# Patient Record
Sex: Female | Born: 1960 | Race: White | Hispanic: No | Smoking: Never smoker
Health system: Southern US, Community
[De-identification: ages and names within clinical notes are randomized; demographics above are authoritative.]

## PROBLEM LIST (undated history)

## (undated) DIAGNOSIS — K5090 Crohn's disease, unspecified, without complications: Secondary | ICD-10-CM

## (undated) DIAGNOSIS — K859 Acute pancreatitis without necrosis or infection, unspecified: Secondary | ICD-10-CM

## (undated) DIAGNOSIS — M549 Dorsalgia, unspecified: Secondary | ICD-10-CM

## (undated) DIAGNOSIS — F988 Other specified behavioral and emotional disorders with onset usually occurring in childhood and adolescence: Secondary | ICD-10-CM

## (undated) DIAGNOSIS — N289 Disorder of kidney and ureter, unspecified: Secondary | ICD-10-CM

## (undated) DIAGNOSIS — G8929 Other chronic pain: Secondary | ICD-10-CM

## (undated) HISTORY — PX: CHOLECYSTECTOMY: SHX55

## (undated) HISTORY — PX: APPENDECTOMY: SHX54

## (undated) HISTORY — PX: ABDOMINAL HYSTERECTOMY: SHX81

---

## 2005-08-07 ENCOUNTER — Emergency Department (HOSPITAL_COMMUNITY): Admission: EM | Admit: 2005-08-07 | Discharge: 2005-08-07 | Payer: Self-pay | Admitting: Emergency Medicine

## 2005-08-16 ENCOUNTER — Emergency Department (HOSPITAL_COMMUNITY): Admission: EM | Admit: 2005-08-16 | Discharge: 2005-08-16 | Payer: Self-pay | Admitting: Emergency Medicine

## 2005-08-23 ENCOUNTER — Emergency Department: Payer: Self-pay | Admitting: Emergency Medicine

## 2005-09-02 ENCOUNTER — Emergency Department: Payer: Self-pay | Admitting: General Practice

## 2005-09-11 ENCOUNTER — Emergency Department (HOSPITAL_COMMUNITY): Admission: EM | Admit: 2005-09-11 | Discharge: 2005-09-11 | Payer: Self-pay | Admitting: Emergency Medicine

## 2005-09-15 ENCOUNTER — Emergency Department (HOSPITAL_COMMUNITY): Admission: EM | Admit: 2005-09-15 | Discharge: 2005-09-15 | Payer: Self-pay | Admitting: Emergency Medicine

## 2006-03-08 ENCOUNTER — Emergency Department (HOSPITAL_COMMUNITY): Admission: EM | Admit: 2006-03-08 | Discharge: 2006-03-08 | Payer: Self-pay | Admitting: Emergency Medicine

## 2006-03-23 ENCOUNTER — Ambulatory Visit (HOSPITAL_COMMUNITY): Admission: RE | Admit: 2006-03-23 | Discharge: 2006-03-23 | Payer: Self-pay | Admitting: Emergency Medicine

## 2007-10-19 ENCOUNTER — Emergency Department (HOSPITAL_BASED_OUTPATIENT_CLINIC_OR_DEPARTMENT_OTHER): Admission: EM | Admit: 2007-10-19 | Discharge: 2007-10-19 | Payer: Self-pay | Admitting: Emergency Medicine

## 2007-11-15 ENCOUNTER — Emergency Department (HOSPITAL_BASED_OUTPATIENT_CLINIC_OR_DEPARTMENT_OTHER): Admission: EM | Admit: 2007-11-15 | Discharge: 2007-11-16 | Payer: Self-pay | Admitting: Emergency Medicine

## 2008-03-08 ENCOUNTER — Emergency Department (HOSPITAL_COMMUNITY): Admission: EM | Admit: 2008-03-08 | Discharge: 2008-03-08 | Payer: Self-pay | Admitting: Emergency Medicine

## 2008-03-29 ENCOUNTER — Ambulatory Visit: Payer: Self-pay | Admitting: Occupational Medicine

## 2008-03-29 LAB — CONVERTED CEMR LAB
Ketones, urine, test strip: NEGATIVE
Nitrite: NEGATIVE
Urobilinogen, UA: 0.2
pH: 7

## 2010-06-18 LAB — URINALYSIS, ROUTINE W REFLEX MICROSCOPIC
Bilirubin Urine: NEGATIVE
Hgb urine dipstick: NEGATIVE
Ketones, ur: NEGATIVE mg/dL
Nitrite: NEGATIVE
Protein, ur: NEGATIVE mg/dL
pH: 8.5 — ABNORMAL HIGH (ref 5.0–8.0)

## 2010-06-18 LAB — URINE MICROSCOPIC-ADD ON

## 2010-06-18 LAB — DIFFERENTIAL
Eosinophils Absolute: 0 10*3/uL (ref 0.0–0.7)
Eosinophils Relative: 0 % (ref 0–5)
Neutrophils Relative %: 64 % (ref 43–77)

## 2010-06-18 LAB — CBC
Hemoglobin: 11.8 g/dL — ABNORMAL LOW (ref 12.0–15.0)
Platelets: 307 10*3/uL (ref 150–400)
RDW: 13.6 % (ref 11.5–15.5)
WBC: 6.8 10*3/uL (ref 4.0–10.5)

## 2010-06-18 LAB — POCT I-STAT, CHEM 8
Calcium, Ion: 1.17 mmol/L (ref 1.12–1.32)
Glucose, Bld: 94 mg/dL (ref 70–99)
HCT: 39 % (ref 36.0–46.0)
Hemoglobin: 13.3 g/dL (ref 12.0–15.0)
Potassium: 3.6 mEq/L (ref 3.5–5.1)
TCO2: 22 mmol/L (ref 0–100)

## 2010-12-05 LAB — DIFFERENTIAL
Monocytes Absolute: 0.5
Monocytes Relative: 7
Neutro Abs: 4.3
Neutrophils Relative %: 59

## 2010-12-05 LAB — URINE CULTURE: Colony Count: >=100000

## 2010-12-05 LAB — URINALYSIS, ROUTINE W REFLEX MICROSCOPIC
Bilirubin Urine: NEGATIVE
Glucose, UA: NEGATIVE
Nitrite: POSITIVE — AB
Urobilinogen, UA: 1

## 2010-12-05 LAB — CBC
HCT: 36.9
MCV: 84.6
RBC: 4.36
RDW: 13.3
WBC: 7.3

## 2010-12-05 LAB — BASIC METABOLIC PANEL
CO2: 24
Chloride: 107
Creatinine, Ser: 1.2
Sodium: 143

## 2010-12-05 LAB — URINE MICROSCOPIC-ADD ON

## 2013-10-30 ENCOUNTER — Emergency Department (HOSPITAL_BASED_OUTPATIENT_CLINIC_OR_DEPARTMENT_OTHER)
Admission: EM | Admit: 2013-10-30 | Discharge: 2013-10-30 | Disposition: A | Discharge status: Home / Self Care | Payer: Medicaid Other | Attending: Emergency Medicine | Admitting: Emergency Medicine

## 2013-10-30 ENCOUNTER — Encounter (HOSPITAL_BASED_OUTPATIENT_CLINIC_OR_DEPARTMENT_OTHER): Payer: Self-pay | Admitting: Emergency Medicine

## 2013-10-30 DIAGNOSIS — Z9089 Acquired absence of other organs: Secondary | ICD-10-CM | POA: Diagnosis not present

## 2013-10-30 DIAGNOSIS — R1031 Right lower quadrant pain: Secondary | ICD-10-CM | POA: Insufficient documentation

## 2013-10-30 DIAGNOSIS — Z90710 Acquired absence of both cervix and uterus: Secondary | ICD-10-CM | POA: Insufficient documentation

## 2013-10-30 DIAGNOSIS — Z8659 Personal history of other mental and behavioral disorders: Secondary | ICD-10-CM | POA: Diagnosis not present

## 2013-10-30 DIAGNOSIS — R509 Fever, unspecified: Secondary | ICD-10-CM | POA: Diagnosis not present

## 2013-10-30 DIAGNOSIS — R109 Unspecified abdominal pain: Secondary | ICD-10-CM

## 2013-10-30 DIAGNOSIS — N3000 Acute cystitis without hematuria: Secondary | ICD-10-CM | POA: Diagnosis not present

## 2013-10-30 DIAGNOSIS — G8929 Other chronic pain: Secondary | ICD-10-CM | POA: Insufficient documentation

## 2013-10-30 DIAGNOSIS — R197 Diarrhea, unspecified: Secondary | ICD-10-CM | POA: Diagnosis not present

## 2013-10-30 DIAGNOSIS — Z792 Long term (current) use of antibiotics: Secondary | ICD-10-CM | POA: Diagnosis not present

## 2013-10-30 DIAGNOSIS — Z79899 Other long term (current) drug therapy: Secondary | ICD-10-CM | POA: Insufficient documentation

## 2013-10-30 HISTORY — DX: Other specified behavioral and emotional disorders with onset usually occurring in childhood and adolescence: F98.8

## 2013-10-30 HISTORY — DX: Disorder of kidney and ureter, unspecified: N28.9

## 2013-10-30 HISTORY — DX: Crohn's disease, unspecified, without complications: K50.90

## 2013-10-30 HISTORY — DX: Other chronic pain: G89.29

## 2013-10-30 HISTORY — DX: Dorsalgia, unspecified: M54.9

## 2013-10-30 LAB — CBC WITH DIFFERENTIAL/PLATELET
BASOS ABS: 0 10*3/uL (ref 0.0–0.1)
Basophils Relative: 0 % (ref 0–1)
Eosinophils Absolute: 0 10*3/uL (ref 0.0–0.7)
Eosinophils Relative: 0 % (ref 0–5)
HEMATOCRIT: 36.3 % (ref 36.0–46.0)
Hemoglobin: 12.5 g/dL (ref 12.0–15.0)
LYMPHS ABS: 2.8 10*3/uL (ref 0.7–4.0)
LYMPHS PCT: 29 % (ref 12–46)
MCH: 28.2 pg (ref 26.0–34.0)
MCHC: 34.4 g/dL (ref 30.0–36.0)
MCV: 81.9 fL (ref 78.0–100.0)
MONO ABS: 0.8 10*3/uL (ref 0.1–1.0)
Monocytes Relative: 8 % (ref 3–12)
NEUTROS ABS: 6 10*3/uL (ref 1.7–7.7)
Neutrophils Relative %: 63 % (ref 43–77)
Platelets: 368 10*3/uL (ref 150–400)
RBC: 4.43 MIL/uL (ref 3.87–5.11)
RDW: 13.1 % (ref 11.5–15.5)
WBC: 9.7 10*3/uL (ref 4.0–10.5)

## 2013-10-30 LAB — URINALYSIS, ROUTINE W REFLEX MICROSCOPIC
Bilirubin Urine: NEGATIVE
GLUCOSE, UA: NEGATIVE mg/dL
Ketones, ur: NEGATIVE mg/dL
Nitrite: POSITIVE — AB
PH: 6.5 (ref 5.0–8.0)
PROTEIN: NEGATIVE mg/dL
SPECIFIC GRAVITY, URINE: 1.018 (ref 1.005–1.030)
Urobilinogen, UA: 1 mg/dL (ref 0.0–1.0)

## 2013-10-30 LAB — COMPREHENSIVE METABOLIC PANEL
ALT: 23 U/L (ref 0–35)
AST: 23 U/L (ref 0–37)
Albumin: 3.9 g/dL (ref 3.5–5.2)
Alkaline Phosphatase: 81 U/L (ref 39–117)
Anion gap: 16 — ABNORMAL HIGH (ref 5–15)
BILIRUBIN TOTAL: 0.3 mg/dL (ref 0.3–1.2)
BUN: 14 mg/dL (ref 6–23)
CHLORIDE: 104 meq/L (ref 96–112)
CO2: 22 meq/L (ref 19–32)
CREATININE: 0.8 mg/dL (ref 0.50–1.10)
Calcium: 9.9 mg/dL (ref 8.4–10.5)
GFR calc Af Amer: >90 mL/min (ref >90)
GFR, EST NON AFRICAN AMERICAN: 84 mL/min — AB (ref >90)
GLUCOSE: 96 mg/dL (ref 70–99)
Potassium: 3.9 mEq/L (ref 3.7–5.3)
SODIUM: 142 meq/L (ref 137–147)
Total Protein: 8.1 g/dL (ref 6.0–8.3)

## 2013-10-30 LAB — URINE MICROSCOPIC-ADD ON

## 2013-10-30 LAB — LIPASE, BLOOD: Lipase: 78 U/L — ABNORMAL HIGH (ref 11–59)

## 2013-10-30 MED ORDER — CEFTRIAXONE SODIUM 1 G IJ SOLR
INTRAMUSCULAR | Status: AC
  Filled 2013-10-30: qty 10

## 2013-10-30 MED ORDER — CEPHALEXIN 500 MG PO CAPS: 500.0000 mg | ORAL_CAPSULE | Freq: Two times a day (BID) | ORAL | Status: DC

## 2013-10-30 MED ORDER — SODIUM CHLORIDE 0.9 % IV SOLN
1000.0000 mL | INTRAVENOUS | Status: DC
  Administered 2013-10-30: 1000 mL via INTRAVENOUS

## 2013-10-30 MED ORDER — ONDANSETRON HCL 4 MG/2ML IJ SOLN
4.0000 mg | Freq: Once | INTRAMUSCULAR | Status: AC
  Administered 2013-10-30: 4 mg via INTRAVENOUS
  Filled 2013-10-30: qty 2

## 2013-10-30 MED ORDER — SODIUM CHLORIDE 0.9 % IV SOLN
1000.0000 mL | Freq: Once | INTRAVENOUS | Status: AC
  Administered 2013-10-30: 1000 mL via INTRAVENOUS

## 2013-10-30 MED ORDER — HYDROMORPHONE HCL PF 1 MG/ML IJ SOLN
1.0000 mg | INTRAMUSCULAR | Status: DC | PRN
  Administered 2013-10-30 (×2): 1 mg via INTRAVENOUS
  Filled 2013-10-30 (×2): qty 1

## 2013-10-30 MED ORDER — PHENAZOPYRIDINE HCL 200 MG PO TABS: 200.0000 mg | ORAL_TABLET | Freq: Three times a day (TID) | ORAL | Status: DC

## 2013-10-30 MED ORDER — CEFTRIAXONE SODIUM 1 G IJ SOLR
1.0000 g | Freq: Once | INTRAMUSCULAR | Status: AC
  Administered 2013-10-30: 1 g via INTRAVENOUS

## 2013-10-30 NOTE — ED Notes (Signed)
Pt c/o abd pain that began 2 days ago. She sts feels like this is a crohn's flare up but it is worse than usual. She is also c/o N/V/D and a fever of 100.

## 2013-10-30 NOTE — ED Provider Notes (Signed)
CSN: [id number]     Arrival date & time 10/30/13  7357 History   First MD Initiated Contact with Patient 10/30/13 0654     Chief Complaint  Patient presents with  . Abdominal Pain    Patient is a 53 y.o. female presenting with abdominal pain. The history is provided by the patient.  Abdominal Pain Pain location:  RLQ Pain quality: aching and sharp   Pain radiates to:  Back Pain severity:  Severe Onset quality:  Gradual Duration:  2 days Timing:  Constant Progression:  Worsening Chronicity:  Recurrent Relieved by:  Nothing Ineffective treatments: Prescription medications. Associated symptoms: diarrhea, fever, nausea and vomiting    the patient has a history of Crohn's disease. She is followed by a physician in [place]. Patient states that this feels like a flareup of her Crohn's disease. She has had several episodes of nausea vomiting associated with loose diarrhea stools. He has noticed some blood streaking in her stool.  The patient states she normally takes oxycodone for her flareups. This is prescribed to her doctor. She also takes some other medication that begins with an L.  These symptoms started a couple of days ago. Her regular medications are not helping. Because of her persistent symptoms she came to the emergency department today. Her doctor works in [place]  Past Medical History  Diagnosis Date  . Crohn's disease   . ADD (attention deficit disorder)   . Renal disorder   . Chronic back pain    Past Surgical History  Procedure Laterality Date  . Abdominal hysterectomy    . Cholecystectomy    . Appendectomy     No family history on file. History  Substance Use Topics  . Smoking status: Never Smoker   . Smokeless tobacco: Not on file  . Alcohol Use: No   OB History   Grav Para Term Preterm Abortions TAB SAB Ect Mult Living                 Review of Systems  Constitutional: Positive for fever.  Gastrointestinal: Positive for nausea, vomiting, abdominal  pain and diarrhea.  All other systems reviewed and are negative.     Allergies  Ketorolac tromethamine; Promethazine hcl; and Propoxyphene n-acetaminophen  Home Medications   Prior to Admission medications   Medication Sig Start Date End Date Taking? Authorizing Provider  amphetamine-dextroamphetamine (ADDERALL XR) 10 MG 24 hr capsule Take 10 mg by mouth daily.   Yes Historical Provider, MD  oxycodone (ROXICODONE) 30 MG immediate release tablet Take 30 mg by mouth every 4 (four) hours as needed for pain.   Yes Historical Provider, MD  Vitamin D, Ergocalciferol, (DRISDOL) 50000 UNITS CAPS capsule Take 50,000 Units by mouth every 7 (seven) days.   Yes Historical Provider, MD  cephALEXin (KEFLEX) 500 MG capsule Take 1 capsule (500 mg total) by mouth 2 (two) times daily. 10/30/13   Linwood Dibbles, MD  phenazopyridine (PYRIDIUM) 200 MG tablet Take 1 tablet (200 mg total) by mouth 3 (three) times daily. 10/30/13   Linwood Dibbles, MD   BP 125/97  Pulse 61  Temp(Src) 97.9 F (36.6 C) (Oral)  Resp 16  Ht 5\' 4"  (1.626 m)  Wt 135 lb (61.236 kg)  BMI 23.16 kg/m2  SpO2 100% Physical Exam  Nursing note and vitals reviewed. Constitutional: She appears well-developed and well-nourished. No distress.  HENT:  Head: Normocephalic and atraumatic.  Right Ear: External ear normal.  Left Ear: External ear normal.  Eyes: Conjunctivae are normal.  Right eye exhibits no discharge. Left eye exhibits no discharge. No scleral icterus.  Neck: Neck supple. No tracheal deviation present.  Cardiovascular: Normal rate, regular rhythm and intact distal pulses.   Pulmonary/Chest: Effort normal and breath sounds normal. No stridor. No respiratory distress. She has no wheezes. She has no rales.  Abdominal: Soft. Bowel sounds are normal. She exhibits no distension. There is tenderness in the right lower quadrant. There is no rebound and no guarding. No hernia.  Musculoskeletal: She exhibits no edema and no tenderness.   Neurological: She is alert. She has normal strength. No cranial nerve deficit (no facial droop, extraocular movements intact, no slurred speech) or sensory deficit. She exhibits normal muscle tone. She displays no seizure activity. Coordination normal.  Skin: Skin is warm and dry. No rash noted.  Psychiatric: She has a normal mood and affect.    ED Course  Procedures (including critical care time) Labs Review Labs Reviewed  URINALYSIS, ROUTINE W REFLEX MICROSCOPIC - Abnormal; Notable for the following:    APPearance CLOUDY (*)    Hgb urine dipstick TRACE (*)    Nitrite POSITIVE (*)    Leukocytes, UA LARGE (*)    All other components within normal limits  COMPREHENSIVE METABOLIC PANEL - Abnormal; Notable for the following:    GFR calc non Af Amer 84 (*)    Anion gap 16 (*)    All other components within normal limits  LIPASE, BLOOD - Abnormal; Notable for the following:    Lipase 78 (*)    All other components within normal limits  URINE MICROSCOPIC-ADD ON - Abnormal; Notable for the following:    Squamous Epithelial / LPF FEW (*)    Bacteria, UA MANY (*)    All other components within normal limits  CBC WITH DIFFERENTIAL    Medications  0.9 %  sodium chloride infusion (not administered)    Followed by  0.9 %  sodium chloride infusion (1,000 mLs Intravenous New Bag/Given 10/30/13 0806)    Followed by  0.9 %  sodium chloride infusion (0 mLs Intravenous Stopped 10/30/13 0805)  HYDROmorphone (DILAUDID) injection 1 mg (1 mg Intravenous Given 10/30/13 0835)  cefTRIAXone (ROCEPHIN) 1 g in dextrose 5 % 50 mL IVPB (not administered)  ondansetron (ZOFRAN) injection 4 mg (4 mg Intravenous Given 10/30/13 0725)    Reviewed [place] controlled substance databse: Regular monthly prescriptions from only one provider.  PA Huel Cote MDM   Final diagnoses:  Acute cystitis without hematuria  Abdominal pain, unspecified abdominal location    Urinalysis is consistent with a urinary tract  infection.  This could be the cause of the patient's symptoms. She has history of Crohn's disease which could be a contributing factor as well but at this point she has no leukocytosis or guarding on abdominal exam. I doubt complicating factors such as abscess or obstruction. Lipase is mildly elevated and I discussed this with the patient at this time I doubt pancreatitis.  Plan will be to discharge her home on a course of oral antibiotics. Continue her home medications. Follow up with her primary doctor this week. Warning signs and precautions were discussed.  Return to emergency room for fever worsening symptoms    Linwood Dibbles, MD 10/30/13 [phone]

## 2013-10-30 NOTE — Discharge Instructions (Signed)
Abdominal Pain, Women °Abdominal (stomach, pelvic, or belly) pain can be caused by many things. It is important to tell your doctor: °· The location of the pain. °· Does it come and go or is it present all the time? °· Are there things that start the pain (eating certain foods, exercise)? °· Are there other symptoms associated with the pain (fever, nausea, vomiting, diarrhea)? °All of this is helpful to know when trying to find the cause of the pain. °CAUSES  °· Stomach: virus or bacteria infection, or ulcer. °· Intestine: appendicitis (inflamed appendix), regional ileitis (Crohn's disease), ulcerative colitis (inflamed colon), irritable bowel syndrome, diverticulitis (inflamed diverticulum of the colon), or cancer of the stomach or intestine. °· Gallbladder disease or stones in the gallbladder. °· Kidney disease, kidney stones, or infection. °· Pancreas infection or cancer. °· Fibromyalgia (pain disorder). °· Diseases of the female organs: °¨ Uterus: fibroid (non-cancerous) tumors or infection. °¨ Fallopian tubes: infection or tubal pregnancy. °¨ Ovary: cysts or tumors. °¨ Pelvic adhesions (scar tissue). °¨ Endometriosis (uterus lining tissue growing in the pelvis and on the pelvic organs). °¨ Pelvic congestion syndrome (female organs filling up with blood just before the menstrual period). °¨ Pain with the menstrual period. °¨ Pain with ovulation (producing an egg). °¨ Pain with an IUD (intrauterine device, birth control) in the uterus. °¨ Cancer of the female organs. °· Functional pain (pain not caused by a disease, may improve without treatment). °· Psychological pain. °· Depression. °DIAGNOSIS  °Your doctor will decide the seriousness of your pain by doing an examination. °· Blood tests. °· X-rays. °· Ultrasound. °· CT scan (computed tomography, special type of X-ray). °· MRI (magnetic resonance imaging). °· Cultures, for infection. °· Barium enema (dye inserted in the large intestine, to better view it with  X-rays). °· Colonoscopy (looking in intestine with a lighted tube). °· Laparoscopy (minor surgery, looking in abdomen with a lighted tube). °· Major abdominal exploratory surgery (looking in abdomen with a large incision). °TREATMENT  °The treatment will depend on the cause of the pain.  °· Many cases can be observed and treated at home. °· Over-the-counter medicines recommended by your caregiver. °· Prescription medicine. °· Antibiotics, for infection. °· Birth control pills, for painful periods or for ovulation pain. °· Hormone treatment, for endometriosis. °· Nerve blocking injections. °· Physical therapy. °· Antidepressants. °· Counseling with a psychologist or psychiatrist. °· Minor or major surgery. °HOME CARE INSTRUCTIONS  °· Do not take laxatives, unless directed by your caregiver. °· Take over-the-counter pain medicine only if ordered by your caregiver. Do not take aspirin because it can cause an upset stomach or bleeding. °· Try a clear liquid diet (broth or water) as ordered by your caregiver. Slowly move to a bland diet, as tolerated, if the pain is related to the stomach or intestine. °· Have a thermometer and take your temperature several times a day, and record it. °· Bed rest and sleep, if it helps the pain. °· Avoid sexual intercourse, if it causes pain. °· Avoid stressful situations. °· Keep your follow-up appointments and tests, as your caregiver orders. °· If the pain does not go away with medicine or surgery, you may try: °¨ Acupuncture. °¨ Relaxation exercises (yoga, meditation). °¨ Group therapy. °¨ Counseling. °SEEK MEDICAL CARE IF:  °· You notice certain foods cause stomach pain. °· Your home care treatment is not helping your pain. °· You need stronger pain medicine. °· You want your IUD removed. °· You feel faint or   lightheaded. °· You develop nausea and vomiting. °· You develop a rash. °· You are having side effects or an allergy to your medicine. °SEEK IMMEDIATE MEDICAL CARE IF:  °· Your  pain does not go away or gets worse. °· You have a fever. °· Your pain is felt only in portions of the abdomen. The right side could possibly be appendicitis. The left lower portion of the abdomen could be colitis or diverticulitis. °· You are passing blood in your stools (bright red or black tarry stools, with or without vomiting). °· You have blood in your urine. °· You develop chills, with or without a fever. °· You pass out. °MAKE SURE YOU:  °· Understand these instructions. °· Will watch your condition. °· Will get help right away if you are not doing well or get worse. °Document Released: 12/16/2006 Document Revised: 07/05/2013 Document Reviewed: 01/05/2009 °ExitCare® Patient Information ©2015 [employer]. This information is not intended to replace advice given to you by your health care provider. Make sure you discuss any questions you have with your health care provider. ° °

## 2013-10-30 NOTE — ED Notes (Signed)
Patient was informed that she would need a ride home because she received 2 doses of IV pain medication. Patient expressed understanding and stated that she had someone coming to pick her up. At discharge, no one was seen arriving for the patient and she left without stopping at check-out.

## 2014-01-31 ENCOUNTER — Encounter (HOSPITAL_BASED_OUTPATIENT_CLINIC_OR_DEPARTMENT_OTHER): Payer: Self-pay | Admitting: *Deleted

## 2014-01-31 ENCOUNTER — Emergency Department (HOSPITAL_BASED_OUTPATIENT_CLINIC_OR_DEPARTMENT_OTHER): Payer: Medicaid Other

## 2014-01-31 ENCOUNTER — Emergency Department (HOSPITAL_BASED_OUTPATIENT_CLINIC_OR_DEPARTMENT_OTHER)
Admission: EM | Admit: 2014-01-31 | Discharge: 2014-01-31 | Disposition: A | Discharge status: Home / Self Care | Payer: Medicaid Other | Attending: Emergency Medicine | Admitting: Emergency Medicine

## 2014-01-31 DIAGNOSIS — N3090 Cystitis, unspecified without hematuria: Secondary | ICD-10-CM

## 2014-01-31 DIAGNOSIS — K5090 Crohn's disease, unspecified, without complications: Secondary | ICD-10-CM | POA: Insufficient documentation

## 2014-01-31 DIAGNOSIS — K50119 Crohn's disease of large intestine with unspecified complications: Secondary | ICD-10-CM

## 2014-01-31 DIAGNOSIS — Z87448 Personal history of other diseases of urinary system: Secondary | ICD-10-CM | POA: Diagnosis not present

## 2014-01-31 DIAGNOSIS — G8929 Other chronic pain: Secondary | ICD-10-CM | POA: Insufficient documentation

## 2014-01-31 DIAGNOSIS — Z90710 Acquired absence of both cervix and uterus: Secondary | ICD-10-CM | POA: Diagnosis not present

## 2014-01-31 DIAGNOSIS — R112 Nausea with vomiting, unspecified: Secondary | ICD-10-CM | POA: Diagnosis present

## 2014-01-31 DIAGNOSIS — F909 Attention-deficit hyperactivity disorder, unspecified type: Secondary | ICD-10-CM | POA: Diagnosis not present

## 2014-01-31 DIAGNOSIS — Z9049 Acquired absence of other specified parts of digestive tract: Secondary | ICD-10-CM | POA: Diagnosis not present

## 2014-01-31 DIAGNOSIS — Z79899 Other long term (current) drug therapy: Secondary | ICD-10-CM | POA: Insufficient documentation

## 2014-01-31 DIAGNOSIS — R109 Unspecified abdominal pain: Secondary | ICD-10-CM

## 2014-01-31 HISTORY — DX: Acute pancreatitis without necrosis or infection, unspecified: K85.90

## 2014-01-31 LAB — URINALYSIS, ROUTINE W REFLEX MICROSCOPIC
Glucose, UA: NEGATIVE mg/dL
Ketones, ur: NEGATIVE mg/dL
NITRITE: NEGATIVE
Protein, ur: NEGATIVE mg/dL
SPECIFIC GRAVITY, URINE: 1.018 (ref 1.005–1.030)
UROBILINOGEN UA: 0.2 mg/dL (ref 0.0–1.0)
pH: 6 (ref 5.0–8.0)

## 2014-01-31 LAB — URINE MICROSCOPIC-ADD ON

## 2014-01-31 LAB — CBC
HCT: 37.9 % (ref 36.0–46.0)
Hemoglobin: 12.8 g/dL (ref 12.0–15.0)
MCH: 27.9 pg (ref 26.0–34.0)
MCHC: 33.8 g/dL (ref 30.0–36.0)
MCV: 82.8 fL (ref 78.0–100.0)
PLATELETS: 132 10*3/uL — AB (ref 150–400)
RBC: 4.58 MIL/uL (ref 3.87–5.11)
RDW: 13.2 % (ref 11.5–15.5)
WBC: 6.4 10*3/uL (ref 4.0–10.5)

## 2014-01-31 LAB — COMPREHENSIVE METABOLIC PANEL
ALT: 12 U/L (ref 0–35)
AST: 17 U/L (ref 0–37)
Albumin: 3.4 g/dL — ABNORMAL LOW (ref 3.5–5.2)
Alkaline Phosphatase: 68 U/L (ref 39–117)
Anion gap: 14 (ref 5–15)
BILIRUBIN TOTAL: 0.3 mg/dL (ref 0.3–1.2)
BUN: 12 mg/dL (ref 6–23)
CALCIUM: 9 mg/dL (ref 8.4–10.5)
CHLORIDE: 103 meq/L (ref 96–112)
CO2: 22 meq/L (ref 19–32)
Creatinine, Ser: 0.8 mg/dL (ref 0.50–1.10)
GFR calc Af Amer: >90 mL/min (ref >90)
GFR, EST NON AFRICAN AMERICAN: 84 mL/min — AB (ref >90)
Glucose, Bld: 85 mg/dL (ref 70–99)
Potassium: 4 mEq/L (ref 3.7–5.3)
SODIUM: 139 meq/L (ref 137–147)
Total Protein: 7.3 g/dL (ref 6.0–8.3)

## 2014-01-31 LAB — LIPASE, BLOOD: Lipase: 34 U/L (ref 11–59)

## 2014-01-31 MED ORDER — SODIUM CHLORIDE 0.9 % IV BOLUS (SEPSIS)
1000.0000 mL | Freq: Once | INTRAVENOUS | Status: AC
  Administered 2014-01-31: 1000 mL via INTRAVENOUS

## 2014-01-31 MED ORDER — ONDANSETRON HCL 4 MG/2ML IJ SOLN
4.0000 mg | Freq: Once | INTRAMUSCULAR | Status: AC
  Administered 2014-01-31: 4 mg via INTRAVENOUS
  Filled 2014-01-31: qty 2

## 2014-01-31 MED ORDER — ONDANSETRON 4 MG PO TBDP: ORAL_TABLET | ORAL | Status: AC

## 2014-01-31 MED ORDER — ONDANSETRON HCL 4 MG/2ML IJ SOLN
INTRAMUSCULAR | Status: AC
  Filled 2014-01-31: qty 2

## 2014-01-31 MED ORDER — CEPHALEXIN 500 MG PO CAPS: 500.0000 mg | ORAL_CAPSULE | Freq: Four times a day (QID) | ORAL | Status: AC

## 2014-01-31 MED ORDER — HYDROMORPHONE HCL 1 MG/ML IJ SOLN
1.0000 mg | Freq: Once | INTRAMUSCULAR | Status: AC
  Administered 2014-01-31: 1 mg via INTRAVENOUS
  Filled 2014-01-31: qty 1

## 2014-01-31 MED ORDER — SODIUM CHLORIDE 0.9 % IV BOLUS (SEPSIS): 1000.0000 mL | Freq: Once | INTRAVENOUS | Status: DC

## 2014-01-31 MED ORDER — IOHEXOL 300 MG/ML  SOLN
100.0000 mL | Freq: Once | INTRAMUSCULAR | Status: AC | PRN
  Administered 2014-01-31: 100 mL via INTRAVENOUS

## 2014-01-31 NOTE — ED Notes (Signed)
Pt reports N/V/D x 5 days.  Pt weak and reports abdominal pain.

## 2014-01-31 NOTE — ED Notes (Signed)
MD at bedside. 

## 2014-01-31 NOTE — ED Provider Notes (Signed)
CSN: [id number]     Arrival date & time 01/31/14  1305 History   First MD Initiated Contact with Patient 01/31/14 1324     Chief Complaint  Patient presents with  . Emesis     (Consider location/radiation/quality/duration/timing/severity/associated sxs/prior Treatment) HPI  Pt with hx of Crohns disease presents with approx 5 days of nausea/vomiting with diarrhea and right sided abdominal pain.  No blood in stools.  No fever.  No dysuria, urgency or frequency.  She is not currently taking any medications for Crohns.  States she has not been able to keep down liquids since thanksgiving day.  Pain is sharp and constant diffusely, worse on the right side.  There are no other associated systemic symptoms, there are no other alleviating or modifying factors.   Past Medical History  Diagnosis Date  . Crohn's disease   . ADD (attention deficit disorder)   . Renal disorder   . Chronic back pain   . Pancreatitis    Past Surgical History  Procedure Laterality Date  . Abdominal hysterectomy    . Cholecystectomy    . Appendectomy     History reviewed. No pertinent family history. History  Substance Use Topics  . Smoking status: Never Smoker   . Smokeless tobacco: Not on file  . Alcohol Use: No   OB History    No data available     Review of Systems  ROS reviewed and all otherwise negative except for mentioned in HPI    Allergies  Ketorolac tromethamine; Promethazine hcl; and Propoxyphene n-acetaminophen  Home Medications   Prior to Admission medications   Medication Sig Start Date End Date Taking? Authorizing Provider  amphetamine-dextroamphetamine (ADDERALL XR) 10 MG 24 hr capsule Take 10 mg by mouth daily.    Historical Provider, MD  oxycodone (ROXICODONE) 30 MG immediate release tablet Take 30 mg by mouth every 4 (four) hours as needed for pain.    Historical Provider, MD  Vitamin D, Ergocalciferol, (DRISDOL) 50000 UNITS CAPS capsule Take 50,000 Units by mouth every 7  (seven) days.    Historical Provider, MD   BP 111/84 mmHg  Pulse 94  Temp(Src) 97.9 F (36.6 C) (Oral)  Resp 18  Wt 130 lb (58.968 kg)  SpO2 100%  Vitals reviewed Physical Exam  Physical Examination: General appearance - alert, well appearing, and in no distress Mental status - alert, oriented to person, place, and time Eyes - no conjunctival injection, no scleral icterus Mouth - mucous membranes moist, pharynx normal without lesions Chest - clear to auscultation, no wheezes, rales or rhonchi, symmetric air entry Heart - normal rate, regular rhythm, normal S1, S2, no murmurs, rubs, clicks or gallops Abdomen - soft, ttp diffusely but worse in right lower abdomen, no gaurding or rebound tenderness, nondistended, no masses or organomegaly Extremities - peripheral pulses normal, no pedal edema, no clubbing or cyanosis Skin - normal coloration and turgor, no rashes  ED Course  Procedures (including critical care time) Labs Review Labs Reviewed  CBC - Abnormal; Notable for the following:    Platelets 132 (*)    All other components within normal limits  URINALYSIS, ROUTINE W REFLEX MICROSCOPIC  COMPREHENSIVE METABOLIC PANEL  LIPASE, BLOOD    Imaging Review No results found.   EKG Interpretation None      MDM   Final diagnoses:  Abdominal pain  nausea, vomiting, diarrhea Crohn's disease    Pt with hx of crohns disease presenting with vomiting, diarrhea, and right sided abdominal pain.  Pt treated with IV fluids, zofran, dilaudid.  Will obtain labs and CT scan to r/o acute complication of crohns.  Pt signed out to Dr. Littie Deeds pending labs and CT scan.  Pt is feeling somewhat improved after her meds in the ED.      Ethelda Chick, MD 02/01/14 [phone]

## 2014-01-31 NOTE — ED Provider Notes (Signed)
  Physical Exam  BP 111/84 mmHg  Pulse 94  Temp(Src) 97.9 F (36.6 C) (Oral)  Resp 18  Wt 130 lb (58.968 kg)  SpO2 100%  Physical Exam  ED Course  Procedures  MDM Assumed care of pt from Dr. Karma Ganja.  Pt has a ho crohns, presents with n/v/d x 5 days.  On assumption of care awaiting cmp and lipase, CT scan.     These returned remarkable for UTI.  CMP unremarkable.  CT scan demonstrated no acute process. Keflex prescription given.  Zofran prescribed.   The primary encounter diagnosis was Crohn's colitis, unspecified complication. Diagnoses of Abdominal pain and Cystitis were also pertinent to this visit.     Mirian Mo, MD 01/31/14 [phone]

## 2014-01-31 NOTE — Discharge Instructions (Signed)
Abdominal Pain °Many things can cause abdominal pain. Usually, abdominal pain is not caused by a disease and will improve without treatment. It can often be observed and treated at home. Your health care provider will do a physical exam and possibly order blood tests and X-rays to help determine the seriousness of your pain. However, in many cases, more time must pass before a clear cause of the pain can be found. Before that point, your health care provider may not know if you need more testing or further treatment. °HOME CARE INSTRUCTIONS  °Monitor your abdominal pain for any changes. The following actions may help to alleviate any discomfort you are experiencing: °· Only take over-the-counter or prescription medicines as directed by your health care provider. °· Do not take laxatives unless directed to do so by your health care provider. °· Try a clear liquid diet (broth, tea, or water) as directed by your health care provider. Slowly move to a bland diet as tolerated. °SEEK MEDICAL CARE IF: °· You have unexplained abdominal pain. °· You have abdominal pain associated with nausea or diarrhea. °· You have pain when you urinate or have a bowel movement. °· You experience abdominal pain that wakes you in the night. °· You have abdominal pain that is worsened or improved by eating food. °· You have abdominal pain that is worsened with eating fatty foods. °· You have a fever. °SEEK IMMEDIATE MEDICAL CARE IF:  °· Your pain does not go away within 2 hours. °· You keep throwing up (vomiting). °· Your pain is felt only in portions of the abdomen, such as the right side or the left lower portion of the abdomen. °· You pass bloody or black tarry stools. °MAKE SURE YOU: °· Understand these instructions.   °· Will watch your condition.   °· Will get help right away if you are not doing well or get worse.   °Document Released: 11/28/2004 Document Revised: 02/23/2013 Document Reviewed: 10/28/2012 °ExitCare® Patient Information  ©2015 [employer]. This information is not intended to replace advice given to you by your health care provider. Make sure you discuss any questions you have with your health care provider. °Urinary Tract Infection °Urinary tract infections (UTIs) can develop anywhere along your urinary tract. Your urinary tract is your body's drainage system for removing wastes and extra water. Your urinary tract includes two kidneys, two ureters, a bladder, and a urethra. Your kidneys are a pair of bean-shaped organs. Each kidney is about the size of your fist. They are located below your ribs, one on each side of your spine. °CAUSES °Infections are caused by microbes, which are microscopic organisms, including fungi, viruses, and bacteria. These organisms are so small that they can only be seen through a microscope. Bacteria are the microbes that most commonly cause UTIs. °SYMPTOMS  °Symptoms of UTIs may vary by age and gender of the patient and by the location of the infection. Symptoms in young women typically include a frequent and intense urge to urinate and a painful, burning feeling in the bladder or urethra during urination. Older women and men are more likely to be tired, shaky, and weak and have muscle aches and abdominal pain. A fever may mean the infection is in your kidneys. Other symptoms of a kidney infection include pain in your back or sides below the ribs, nausea, and vomiting. °DIAGNOSIS °To diagnose a UTI, your caregiver will ask you about your symptoms. Your caregiver also will ask to provide a urine sample. The urine sample   will be tested for bacteria and white blood cells. White blood cells are made by your body to help fight infection. °TREATMENT  °Typically, UTIs can be treated with medication. Because most UTIs are caused by a bacterial infection, they usually can be treated with the use of antibiotics. The choice of antibiotic and length of treatment depend on your symptoms and the type of bacteria  causing your infection. °HOME CARE INSTRUCTIONS °· If you were prescribed antibiotics, take them exactly as your caregiver instructs you. Finish the medication even if you feel better after you have only taken some of the medication. °· Drink enough water and fluids to keep your urine clear or pale yellow. °· Avoid caffeine, tea, and carbonated beverages. They tend to irritate your bladder. °· Empty your bladder often. Avoid holding urine for long periods of time. °· Empty your bladder before and after sexual intercourse. °· After a bowel movement, women should cleanse from front to back. Use each tissue only once. °SEEK MEDICAL CARE IF:  °· You have back pain. °· You develop a fever. °· Your symptoms do not begin to resolve within 3 days. °SEEK IMMEDIATE MEDICAL CARE IF:  °· You have severe back pain or lower abdominal pain. °· You develop chills. °· You have nausea or vomiting. °· You have continued burning or discomfort with urination. °MAKE SURE YOU:  °· Understand these instructions. °· Will watch your condition. °· Will get help right away if you are not doing well or get worse. °Document Released: 11/28/2004 Document Revised: 08/20/2011 Document Reviewed: 03/29/2011 °ExitCare® Patient Information ©2015 [employer]. This information is not intended to replace advice given to you by your health care provider. Make sure you discuss any questions you have with your health care provider. ° °

## 2014-07-22 ENCOUNTER — Emergency Department (HOSPITAL_BASED_OUTPATIENT_CLINIC_OR_DEPARTMENT_OTHER)
Admission: EM | Admit: 2014-07-22 | Discharge: 2014-07-22 | Discharge status: Against Medical Advice | Payer: Medicaid Other | Attending: Emergency Medicine | Admitting: Emergency Medicine

## 2014-07-22 ENCOUNTER — Encounter (HOSPITAL_BASED_OUTPATIENT_CLINIC_OR_DEPARTMENT_OTHER): Payer: Self-pay | Admitting: *Deleted

## 2014-07-22 DIAGNOSIS — R109 Unspecified abdominal pain: Secondary | ICD-10-CM | POA: Diagnosis present

## 2014-07-22 DIAGNOSIS — G8929 Other chronic pain: Secondary | ICD-10-CM | POA: Insufficient documentation

## 2014-07-22 NOTE — ED Notes (Signed)
Pt not in room. Lawson Fiscal, charge nurse states that pt told her she needed to leave to check on a truck problem, and that she would need to come back later. Lawson Fiscal explained to pt that she would have to check in and register again.

## 2014-07-22 NOTE — ED Notes (Signed)
Abdominal pain x 2 days. She was dropped off by a neighbor and has a 52 year old child with her. She is aware she will need a driver before narcotic pain medication can be administered.

## 2014-07-22 NOTE — ED Provider Notes (Signed)
Patient not in room on attempted evaluation.  Glynn Octave, MD 07/22/14 1723

## 2014-12-23 IMAGING — CT CT ABD-PELV W/ CM
2 of 5 series · 16 of 46 positions shown, 18 images · IV contrast (APPLIED)
Comparison: Stone study of 03/08/2008. Contrast-enhanced CT of
08/15/2008. 11/15/2007 stone study.

CLINICAL DATA: Abdominal pain for 5 days with nausea vomiting and
diarrhea. Right lower quadrant pain. History of Crohn disease.
Pancreatitis. Appendectomy. Hysterectomy. Cholecystectomy.

EXAM:
CT ABDOMEN AND PELVIS WITH CONTRAST
TECHNIQUE: Multidetector CT imaging of the abdomen and pelvis was performed
using the standard protocol following bolus administration of
intravenous contrast.
CONTRAST:  100mL OMNIPAQUE IOHEXOL 300 MG/ML  SOLN

[Series 2: abd/pelvis 5.0 b31f · axial · 0.73mm/px · z∈[-456,-66]mm · 13 of 88 slices shown, 15 images]
[im 5/88  soft-tissue]
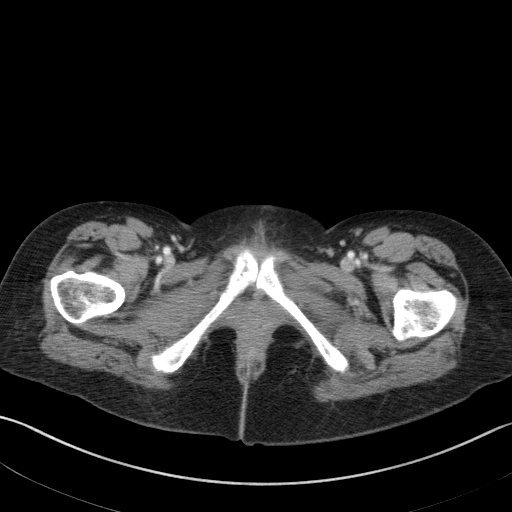
[im 5/88  bone]
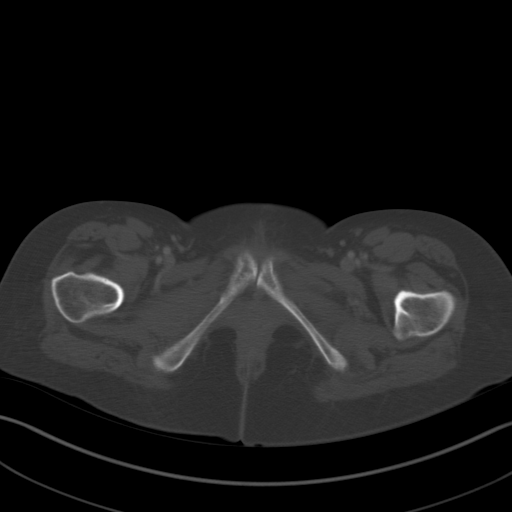
[im 14/88  soft-tissue]
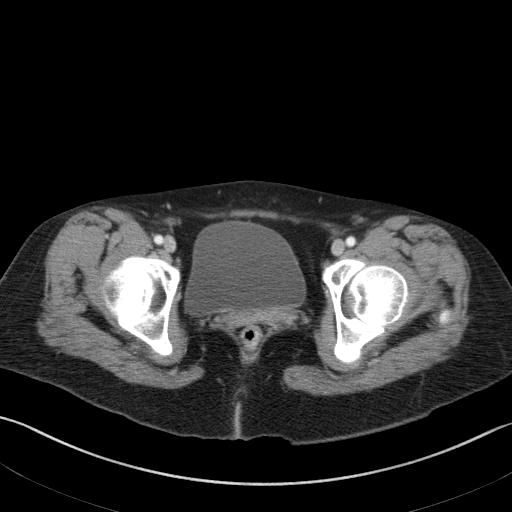
[im 18/88  soft-tissue]
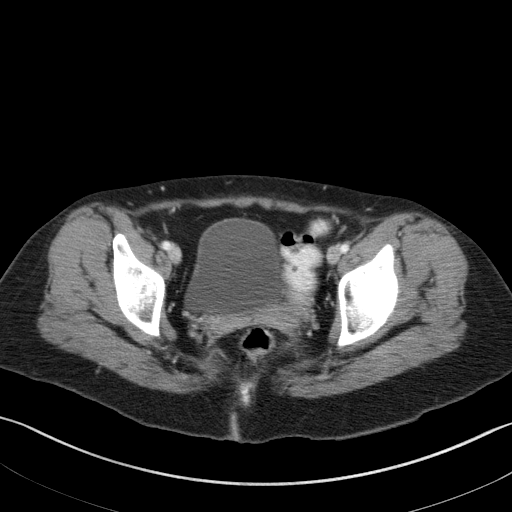
[im 27/88  soft-tissue]
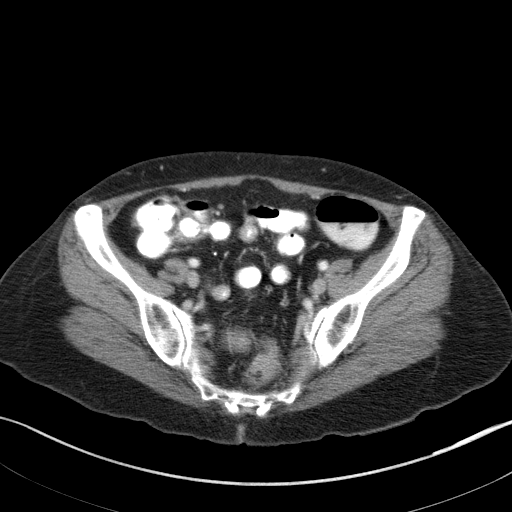
[im 31/88  soft-tissue]
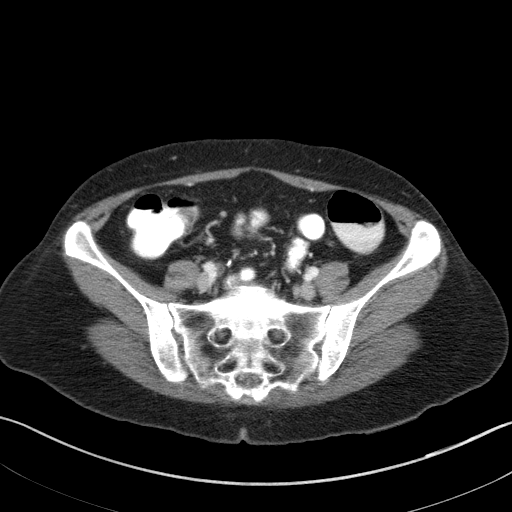
[im 40/88  soft-tissue]
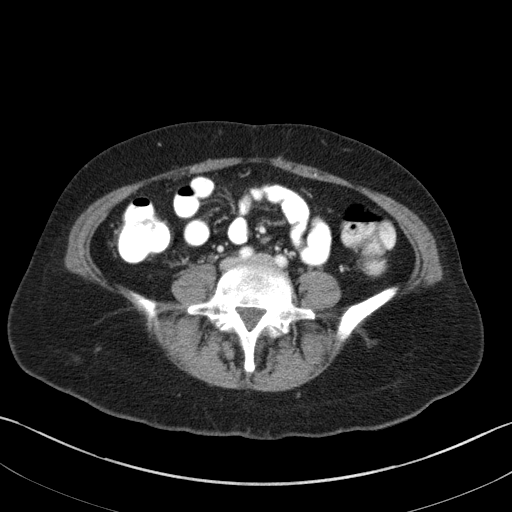
[im 44/88  soft-tissue]
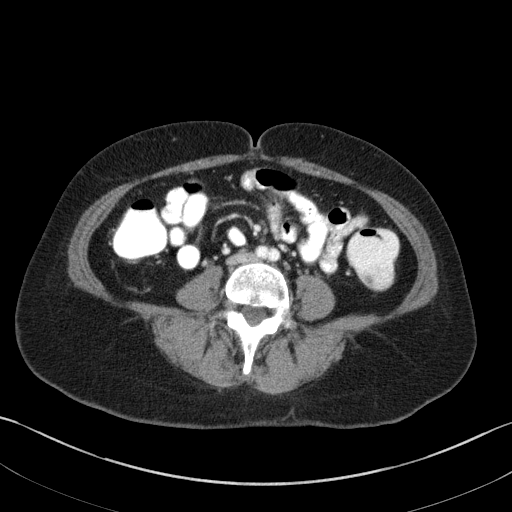
[im 48/88  soft-tissue]
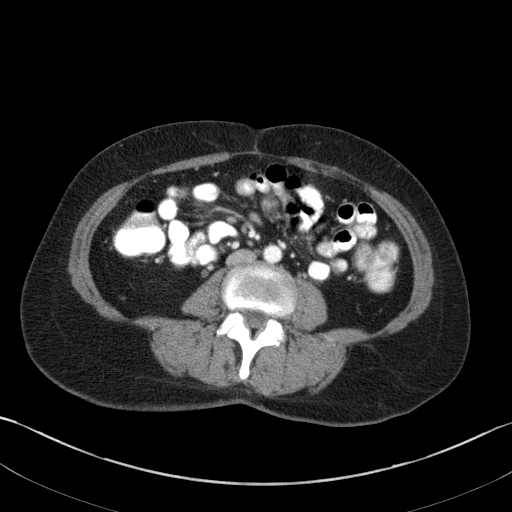
[im 57/88  soft-tissue]
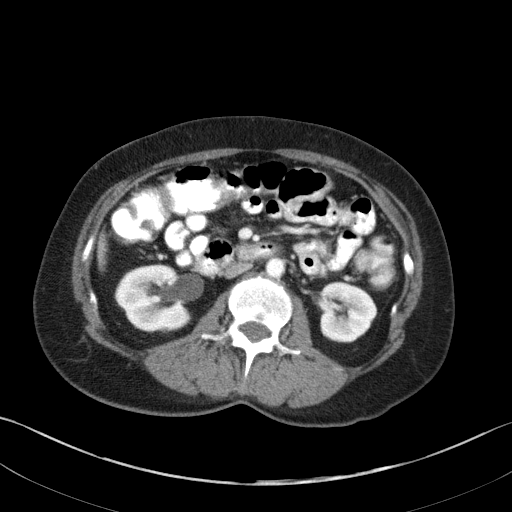
[im 57/88  bone]
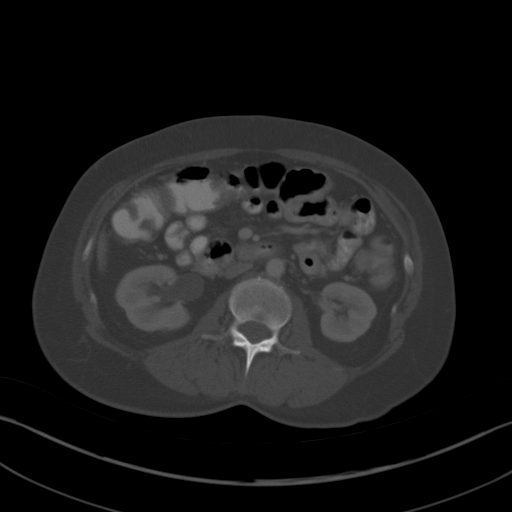
[im 61/88  soft-tissue]
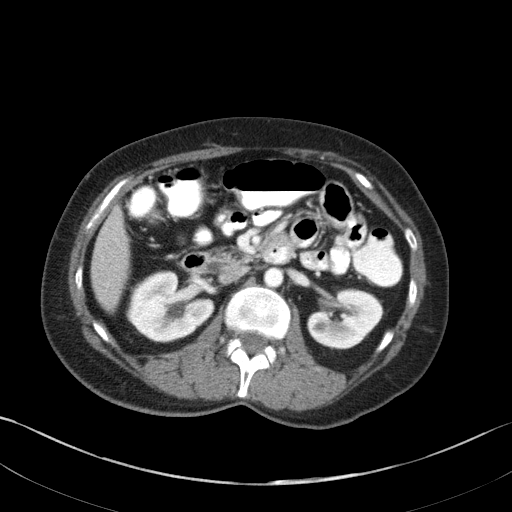
[im 70/88  soft-tissue]
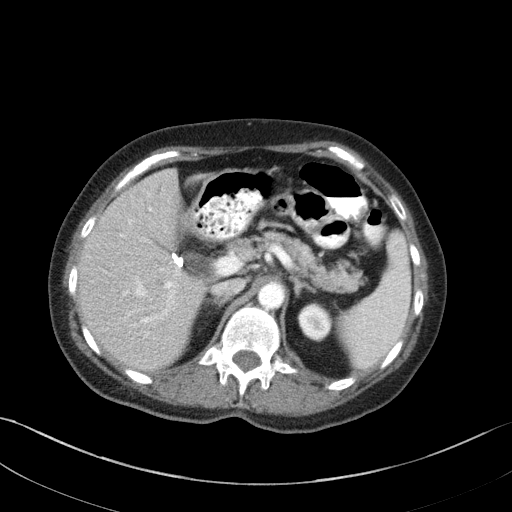
[im 74/88  soft-tissue]
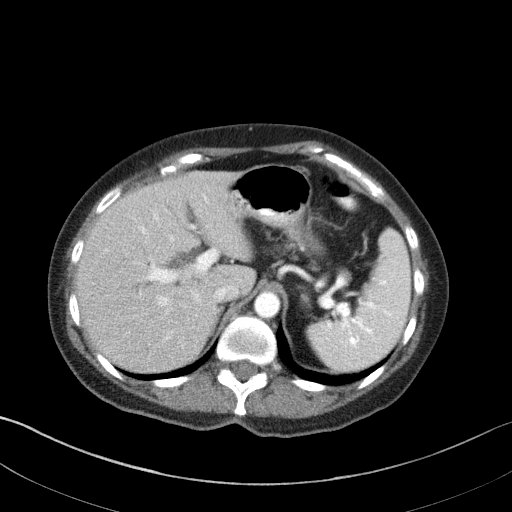
[im 83/88  soft-tissue]
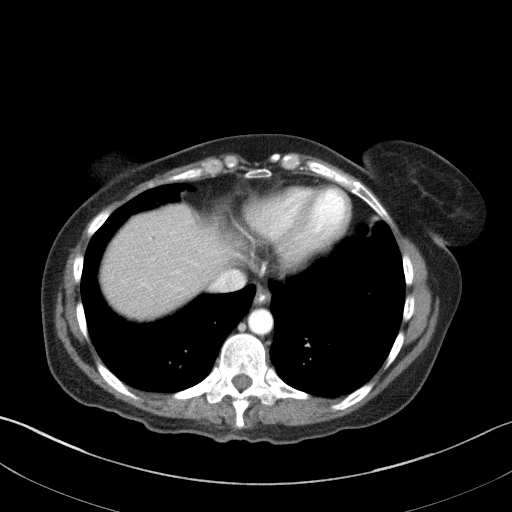

[Series 5: abd/pelvis 3.0 coronal · coronal · 0.73mm/px · 3 of 71 slices shown]
[im 24/71  soft-tissue]
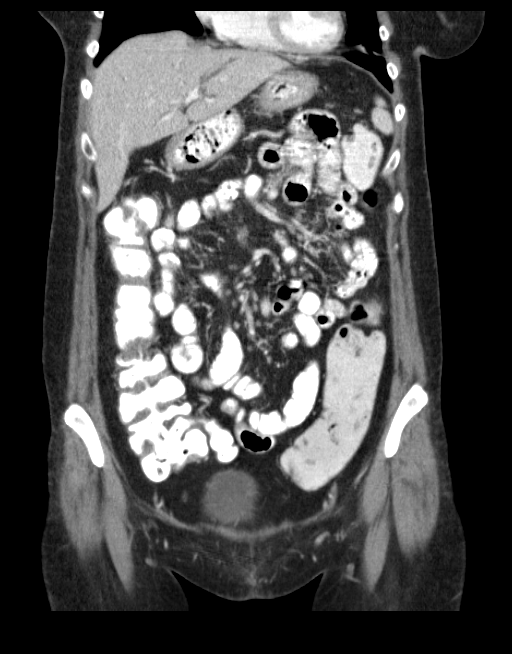
[im 32/71  soft-tissue]
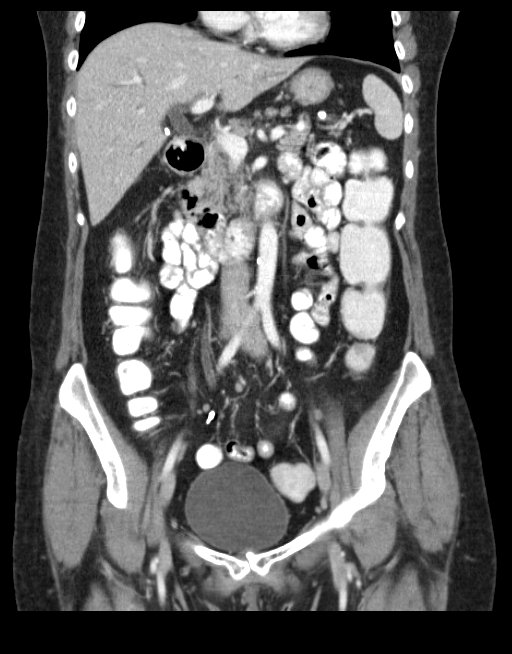
[im 39/71  soft-tissue]
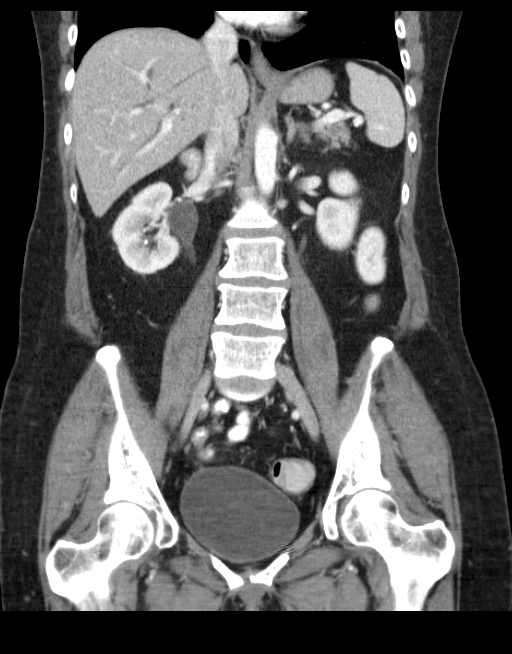

[16 of 46 positions shown; findings below may reference images not displayed]

FINDINGS: Lower chest: Volume loss at the anterior lung bases bilaterally.
Normal heart size without pericardial or pleural effusion.

Hepatobiliary: Focal steatosis adjacent the falciform ligament. Mild
intrahepatic biliary ductal dilatation after cholecystectomy. This
is new. The common duct measures 12 mm on coronal image 36 in the
porta hepatis. Tapers minimally at 11 mm in the region the
pancreatic head. Increased in size since the prior exam.

Pancreas: Mild pancreatic atrophy, without ductal dilatation or
mass. No evidence of acute pancreatitis.

Spleen: Splenic calcifications, likely related to remote
granulomatous disease.

Adrenals/Urinary Tract: Normal adrenal glands. Lower pole right
renal collecting system stone. Bilateral renal lesions which are too
small to characterize but favored to represent cysts. No
hydronephrosis. Normal urinary bladder.

Stomach/Bowel: Normal stomach. Fluid-filled colon, suggesting a
diarrheal state. Normal terminal ileum. Appendectomy. Normal small
bowel.

Vascular/Lymphatic: Aortic and branch vessel atherosclerosis. No
abdominopelvic adenopathy.

Reproductive: Hysterectomy.  No adnexal mass.

Other:  No significant free fluid.

Musculoskeletal: No acute osseous abnormality. Minimal convex left
lumbar spine curvature.
IMPRESSION: 1. Cholecystectomy with new mild biliary ductal dilatation. No cause
identified. Consider correlation with bilirubin levels. If these are
elevated, consider MRCP or ERCP.
2. Fluid-filled colon, suggesting a diarrheal state.
